# Patient Record
Sex: Male | Born: 1975 | Race: White | Hispanic: No | Marital: Single | Smoking: Current every day smoker
Health system: Southern US, Community
[De-identification: ages and names within clinical notes are randomized; demographics above are authoritative.]

## PROBLEM LIST (undated history)

## (undated) DIAGNOSIS — I10 Essential (primary) hypertension: Secondary | ICD-10-CM

## (undated) DIAGNOSIS — K859 Acute pancreatitis without necrosis or infection, unspecified: Secondary | ICD-10-CM

## (undated) DIAGNOSIS — F1011 Alcohol abuse, in remission: Secondary | ICD-10-CM

## (undated) HISTORY — PX: APPENDECTOMY: SHX54

## (undated) HISTORY — PX: BACK SURGERY: SHX140

---

## 2018-02-06 ENCOUNTER — Emergency Department (HOSPITAL_BASED_OUTPATIENT_CLINIC_OR_DEPARTMENT_OTHER)
Admission: EM | Admit: 2018-02-06 | Discharge: 2018-02-06 | Disposition: A | Discharge status: Home / Self Care | Payer: Self-pay | Attending: Emergency Medicine | Admitting: Emergency Medicine

## 2018-02-06 ENCOUNTER — Encounter (HOSPITAL_BASED_OUTPATIENT_CLINIC_OR_DEPARTMENT_OTHER): Payer: Self-pay | Admitting: Emergency Medicine

## 2018-02-06 ENCOUNTER — Other Ambulatory Visit: Payer: Self-pay

## 2018-02-06 DIAGNOSIS — F17200 Nicotine dependence, unspecified, uncomplicated: Secondary | ICD-10-CM | POA: Insufficient documentation

## 2018-02-06 DIAGNOSIS — I10 Essential (primary) hypertension: Secondary | ICD-10-CM | POA: Insufficient documentation

## 2018-02-06 DIAGNOSIS — R000 Tachycardia, unspecified: Secondary | ICD-10-CM | POA: Insufficient documentation

## 2018-02-06 DIAGNOSIS — R1084 Generalized abdominal pain: Secondary | ICD-10-CM | POA: Insufficient documentation

## 2018-02-06 DIAGNOSIS — R112 Nausea with vomiting, unspecified: Secondary | ICD-10-CM | POA: Insufficient documentation

## 2018-02-06 HISTORY — DX: Acute pancreatitis without necrosis or infection, unspecified: K85.90

## 2018-02-06 HISTORY — DX: Essential (primary) hypertension: I10

## 2018-02-06 LAB — URINALYSIS, ROUTINE W REFLEX MICROSCOPIC
Glucose, UA: NEGATIVE mg/dL
Ketones, ur: >80 mg/dL — AB
Leukocytes, UA: NEGATIVE
Nitrite: NEGATIVE
Protein, ur: >300 mg/dL — AB
Specific Gravity, Urine: >1.03 — ABNORMAL HIGH (ref 1.005–1.030)
pH: 6 (ref 5.0–8.0)

## 2018-02-06 LAB — CBC WITH DIFFERENTIAL/PLATELET
Basophils Absolute: 0 10*3/uL (ref 0.0–0.1)
Basophils Relative: 0 %
Eosinophils Absolute: 0 10*3/uL (ref 0.0–0.7)
Eosinophils Relative: 0 %
HCT: 49.3 % (ref 39.0–52.0)
Hemoglobin: 17.5 g/dL — ABNORMAL HIGH (ref 13.0–17.0)
Lymphocytes Relative: 2 %
Lymphs Abs: 0.3 10*3/uL — ABNORMAL LOW (ref 0.7–4.0)
MCH: 34.7 pg — ABNORMAL HIGH (ref 26.0–34.0)
MCHC: 35.5 g/dL (ref 30.0–36.0)
MCV: 97.8 fL (ref 78.0–100.0)
Monocytes Absolute: 1.3 10*3/uL — ABNORMAL HIGH (ref 0.1–1.0)
Monocytes Relative: 8 %
Neutro Abs: 14.2 10*3/uL — ABNORMAL HIGH (ref 1.7–7.7)
Neutrophils Relative %: 90 %
Platelets: 258 10*3/uL (ref 150–400)
RBC: 5.04 MIL/uL (ref 4.22–5.81)
RDW: 12.2 % (ref 11.5–15.5)
WBC: 15.9 10*3/uL — ABNORMAL HIGH (ref 4.0–10.5)

## 2018-02-06 LAB — COMPREHENSIVE METABOLIC PANEL
ALT: 57 U/L — ABNORMAL HIGH (ref 0–44)
AST: 76 U/L — ABNORMAL HIGH (ref 15–41)
Albumin: 5.5 g/dL — ABNORMAL HIGH (ref 3.5–5.0)
Alkaline Phosphatase: 79 U/L (ref 38–126)
Anion gap: 40 — ABNORMAL HIGH (ref 5–15)
BUN: 27 mg/dL — ABNORMAL HIGH (ref 6–20)
CO2: 13 mmol/L — ABNORMAL LOW (ref 22–32)
Calcium: 9.7 mg/dL (ref 8.9–10.3)
Chloride: 85 mmol/L — ABNORMAL LOW (ref 98–111)
Creatinine, Ser: 1.93 mg/dL — ABNORMAL HIGH (ref 0.61–1.24)
GFR calc Af Amer: 47 mL/min — ABNORMAL LOW (ref >60)
GFR calc non Af Amer: 41 mL/min — ABNORMAL LOW (ref >60)
Glucose, Bld: 208 mg/dL — ABNORMAL HIGH (ref 70–99)
Potassium: 3.6 mmol/L (ref 3.5–5.1)
Sodium: 138 mmol/L (ref 135–145)
Total Bilirubin: 4.5 mg/dL — ABNORMAL HIGH (ref 0.3–1.2)
Total Protein: 9.7 g/dL — ABNORMAL HIGH (ref 6.5–8.1)

## 2018-02-06 LAB — URINALYSIS, MICROSCOPIC (REFLEX)

## 2018-02-06 LAB — LIPASE, BLOOD: Lipase: 66 U/L — ABNORMAL HIGH (ref 11–51)

## 2018-02-06 MED ORDER — ONDANSETRON HCL 4 MG/2ML IJ SOLN
4.0000 mg | Freq: Once | INTRAMUSCULAR | Status: AC
  Administered 2018-02-06: 4 mg via INTRAVENOUS
  Filled 2018-02-06: qty 2

## 2018-02-06 MED ORDER — GI COCKTAIL ~~LOC~~
30.0000 mL | Freq: Once | ORAL | Status: AC
  Administered 2018-02-06: 30 mL via ORAL
  Filled 2018-02-06: qty 30

## 2018-02-06 MED ORDER — HALOPERIDOL LACTATE 5 MG/ML IJ SOLN
2.0000 mg | Freq: Once | INTRAMUSCULAR | Status: AC
  Administered 2018-02-06: 2 mg via INTRAVENOUS
  Filled 2018-02-06: qty 1

## 2018-02-06 MED ORDER — ONDANSETRON HCL 4 MG PO TABS: 4.0000 mg | ORAL_TABLET | Freq: Four times a day (QID) | ORAL | 0 refills | Status: DC

## 2018-02-06 MED ORDER — HYDROMORPHONE HCL 1 MG/ML IJ SOLN
1.0000 mg | Freq: Once | INTRAMUSCULAR | Status: AC
  Administered 2018-02-06: 1 mg via INTRAVENOUS
  Filled 2018-02-06: qty 1

## 2018-02-06 MED ORDER — CAPSAICIN 0.075 % EX CREA
TOPICAL_CREAM | Freq: Two times a day (BID) | CUTANEOUS | Status: DC
  Administered 2018-02-06: 09:00:00 via TOPICAL
  Filled 2018-02-06: qty 60

## 2018-02-06 MED ORDER — LACTATED RINGERS IV BOLUS
1000.0000 mL | Freq: Once | INTRAVENOUS | Status: AC
  Administered 2018-02-06: 1000 mL via INTRAVENOUS

## 2018-02-06 MED ORDER — SODIUM CHLORIDE 0.9 % IV BOLUS
1000.0000 mL | Freq: Once | INTRAVENOUS | Status: AC
  Administered 2018-02-06: 1000 mL via INTRAVENOUS

## 2018-02-06 MED ORDER — FAMOTIDINE IN NACL 20-0.9 MG/50ML-% IV SOLN
20.0000 mg | Freq: Once | INTRAVENOUS | Status: AC
  Administered 2018-02-06: 20 mg via INTRAVENOUS
  Filled 2018-02-06: qty 50

## 2018-02-06 MED FILL — ONDANSETRON HCL 4 MG TABLET: 4 | 2 days supply | Qty: 8 | Fill #0

## 2018-02-06 NOTE — ED Triage Notes (Signed)
Abdominal pain with N/V x 2 days, no diarrhea.  No known fever.

## 2018-02-06 NOTE — ED Provider Notes (Signed)
[facility] EMERGENCY DEPARTMENT Provider Note   CSN: [id number] Arrival date & time: 02/06/18  0800     History   Chief Complaint Chief Complaint  Patient presents with  . Abdominal Pain    HPI Brandon Fletcher is a 42 y.o. male.  HPI   42yM with 2d history of abdominal pain and n/v. Says he hasnt kept anything down in past day. No fever or chills. No diarrhea. Diffuse abdominal pain. No sick contacts. No urinary complaints. Drinks alcohol regularly. Occasional marijuana. Hasn't tired anything for symptoms.   Past Medical History:  Diagnosis Date  . Hypertension   . Pancreatitis     There are no active problems to display for this patient.        Home Medications    Prior to Admission medications   Not on File    Family History No family history on file.  Social History Social History   Tobacco Use  . Smoking status: Current Every Day Smoker  . Smokeless tobacco: Never Used  Substance Use Topics  . Alcohol use: Yes  . Drug use: Yes    Types: Marijuana     Allergies   Patient has no known allergies.   Review of Systems Review of Systems All systems reviewed and negative, other than as noted in HPI.  Physical Exam Updated Vital Signs BP (!) 188/122 (BP Location: Left Arm)   Pulse (!) 126   Temp (!) 97.5 F (36.4 C) (Oral)   Ht 6' (1.829 m)   Wt 79.4 kg   SpO2 99%   BMI 23.73 kg/m   Physical Exam  Constitutional: He appears well-developed and well-nourished. No distress.  HENT:  Head: Normocephalic and atraumatic.  Eyes: Conjunctivae are normal. Right eye exhibits no discharge. Left eye exhibits no discharge.  Neck: Neck supple.  Cardiovascular: Regular rhythm and normal heart sounds. Exam reveals no gallop and no friction rub.  No murmur heard. tachycardic  Pulmonary/Chest: Effort normal and breath sounds normal. No respiratory distress.  Abdominal: Soft. He exhibits no distension. There is tenderness.  Mild diffuse ttp    Musculoskeletal: He exhibits no edema or tenderness.  Neurological: He is alert.  Skin: Skin is warm and dry.  Psychiatric: He has a normal mood and affect. His behavior is normal. Thought content normal.  Nursing note and vitals reviewed.   All systems reviewed and negative, other than as noted in HPI.  ED Treatments / Results  Labs (all labs ordered are listed, but only abnormal results are displayed) Labs Reviewed  LIPASE, BLOOD - Abnormal; Notable for the following components:      Result Value   Lipase 66 (*)    All other components within normal limits  URINALYSIS, ROUTINE W REFLEX MICROSCOPIC - Abnormal; Notable for the following components:   Specific Gravity, Urine >1.030 (*)    Hgb urine dipstick LARGE (*)    Bilirubin Urine MODERATE (*)    Ketones, ur >80 (*)    Protein, ur >300 (*)    All other components within normal limits  COMPREHENSIVE METABOLIC PANEL - Abnormal; Notable for the following components:   Chloride 85 (*)    CO2 13 (*)    Glucose, Bld 208 (*)    BUN 27 (*)    Creatinine, Ser 1.93 (*)    Total Protein 9.7 (*)    Albumin 5.5 (*)    AST 76 (*)    ALT 57 (*)    Total Bilirubin 4.5 (*)  GFR calc non Af Amer 41 (*)    GFR calc Af Amer 47 (*)    Anion gap 40 (*)    All other components within normal limits  CBC WITH DIFFERENTIAL/PLATELET - Abnormal; Notable for the following components:   WBC 15.9 (*)    Hemoglobin 17.5 (*)    MCH 34.7 (*)    Neutro Abs 14.2 (*)    Lymphs Abs 0.3 (*)    Monocytes Absolute 1.3 (*)    All other components within normal limits  URINALYSIS, MICROSCOPIC (REFLEX) - Abnormal; Notable for the following components:   Bacteria, UA MANY (*)    All other components within normal limits    EKG None  Radiology No results found.  Procedures Procedures (including critical care time)  Medications Ordered in ED Medications  lactated ringers bolus 1,000 mL (1,000 mLs Intravenous IV Pump Association 02/06/18 0832)   famotidine (PEPCID) IVPB 20 mg premix ( Intravenous Rate/Dose Verify 02/06/18 0835)  ondansetron (ZOFRAN) injection 4 mg (4 mg Intravenous Given 02/06/18 0824)  HYDROmorphone (DILAUDID) injection 1 mg (1 mg Intravenous Given 02/06/18 0825)     Initial Impression / Assessment and Plan / ED Course  I have reviewed the triage vital signs and the nursing notes.  Pertinent labs & imaging results that were available during my care of the patient were reviewed by me and considered in my medical decision making (see chart for details).  42yM with abdominal pain and n/v. Suspect gastritis. Pt admits to drinking several "cocktails" per day. He got the most relief after having a GI "cocktail" here. Diffsue abdominal tenderness on exam. Markedly elevated anion gap acidosis. He received 2 L of IVF and meds. He is not interested in being admitted. Recommended staying for an additional liter of IVF particularly since he does not have established outpt FU but he declines. Symptoms much better. "I feel great man. I need to catch an Benedetto Goad so I can try and salvage some of the work day." He is tolerating PO. Advised of lab abnormalities. Advised of health risks of continued heavy drinking. Return precautions discussed. Prescribed zofran. Encouraged to drink plenty of fluids.   Final Clinical Impressions(s) / ED Diagnoses   Final diagnoses:  Generalized abdominal pain  Non-intractable vomiting with nausea, unspecified vomiting type    ED Discharge Orders         Ordered    ondansetron (ZOFRAN) 4 MG tablet  Every 6 hours     02/06/18 1032           Raeford Razor, MD 02/07/18 [phone]

## 2018-06-30 ENCOUNTER — Emergency Department (HOSPITAL_BASED_OUTPATIENT_CLINIC_OR_DEPARTMENT_OTHER)
Admission: EM | Admit: 2018-06-30 | Discharge: 2018-07-01 | Disposition: A | Discharge status: Home / Self Care | Payer: Self-pay | Attending: Emergency Medicine | Admitting: Emergency Medicine

## 2018-06-30 ENCOUNTER — Encounter (HOSPITAL_BASED_OUTPATIENT_CLINIC_OR_DEPARTMENT_OTHER): Payer: Self-pay

## 2018-06-30 ENCOUNTER — Other Ambulatory Visit: Payer: Self-pay

## 2018-06-30 DIAGNOSIS — F1010 Alcohol abuse, uncomplicated: Secondary | ICD-10-CM | POA: Insufficient documentation

## 2018-06-30 DIAGNOSIS — R1110 Vomiting, unspecified: Secondary | ICD-10-CM

## 2018-06-30 DIAGNOSIS — Z79899 Other long term (current) drug therapy: Secondary | ICD-10-CM | POA: Insufficient documentation

## 2018-06-30 DIAGNOSIS — F17200 Nicotine dependence, unspecified, uncomplicated: Secondary | ICD-10-CM | POA: Insufficient documentation

## 2018-06-30 DIAGNOSIS — I10 Essential (primary) hypertension: Secondary | ICD-10-CM | POA: Insufficient documentation

## 2018-06-30 HISTORY — DX: Alcohol abuse, in remission: F10.11

## 2018-06-30 LAB — CBC
HCT: 50.5 % (ref 39.0–52.0)
Hemoglobin: 17.8 g/dL — ABNORMAL HIGH (ref 13.0–17.0)
MCH: 34.3 pg — ABNORMAL HIGH (ref 26.0–34.0)
MCHC: 35.2 g/dL (ref 30.0–36.0)
MCV: 97.3 fL (ref 80.0–100.0)
NRBC: 0 % (ref 0.0–0.2)
Platelets: 382 10*3/uL (ref 150–400)
RBC: 5.19 MIL/uL (ref 4.22–5.81)
RDW: 11.4 % — ABNORMAL LOW (ref 11.5–15.5)
WBC: 10.6 10*3/uL — AB (ref 4.0–10.5)

## 2018-06-30 LAB — COMPREHENSIVE METABOLIC PANEL
ALBUMIN: 5.1 g/dL — AB (ref 3.5–5.0)
ALK PHOS: 77 U/L (ref 38–126)
ALT: 33 U/L (ref 0–44)
AST: 52 U/L — ABNORMAL HIGH (ref 15–41)
Anion gap: 17 — ABNORMAL HIGH (ref 5–15)
BILIRUBIN TOTAL: 1.5 mg/dL — AB (ref 0.3–1.2)
BUN: 8 mg/dL (ref 6–20)
CO2: 22 mmol/L (ref 22–32)
Calcium: 10.6 mg/dL — ABNORMAL HIGH (ref 8.9–10.3)
Chloride: 97 mmol/L — ABNORMAL LOW (ref 98–111)
Creatinine, Ser: 0.91 mg/dL (ref 0.61–1.24)
GFR calc non Af Amer: >60 mL/min (ref >60)
GLUCOSE: 169 mg/dL — AB (ref 70–99)
Potassium: 3.3 mmol/L — ABNORMAL LOW (ref 3.5–5.1)
Sodium: 136 mmol/L (ref 135–145)
TOTAL PROTEIN: 8.7 g/dL — AB (ref 6.5–8.1)

## 2018-06-30 LAB — URINALYSIS, MICROSCOPIC (REFLEX): Bacteria, UA: NONE SEEN

## 2018-06-30 LAB — URINALYSIS, ROUTINE W REFLEX MICROSCOPIC
Bilirubin Urine: NEGATIVE
Glucose, UA: NEGATIVE mg/dL
Hgb urine dipstick: NEGATIVE
Ketones, ur: 40 mg/dL — AB
Leukocytes,Ua: NEGATIVE
NITRITE: NEGATIVE
pH: 7 (ref 5.0–8.0)

## 2018-06-30 LAB — RAPID URINE DRUG SCREEN, HOSP PERFORMED
AMPHETAMINES: NOT DETECTED
Barbiturates: NOT DETECTED
Benzodiazepines: NOT DETECTED
COCAINE: NOT DETECTED
OPIATES: NOT DETECTED
TETRAHYDROCANNABINOL: POSITIVE — AB

## 2018-06-30 LAB — LIPASE, BLOOD: Lipase: 68 U/L — ABNORMAL HIGH (ref 11–51)

## 2018-06-30 MED ORDER — DIPHENHYDRAMINE HCL 50 MG/ML IJ SOLN
50.0000 mg | Freq: Once | INTRAMUSCULAR | Status: AC
  Administered 2018-06-30: 50 mg via INTRAVENOUS
  Filled 2018-06-30: qty 1

## 2018-06-30 MED ORDER — METOCLOPRAMIDE HCL 5 MG/ML IJ SOLN
10.0000 mg | Freq: Once | INTRAMUSCULAR | Status: AC
  Administered 2018-06-30: 10 mg via INTRAVENOUS
  Filled 2018-06-30: qty 2

## 2018-06-30 MED ORDER — CLONIDINE HCL 0.1 MG PO TABS
0.2000 mg | ORAL_TABLET | Freq: Once | ORAL | Status: AC
  Administered 2018-06-30: 0.2 mg via ORAL
  Filled 2018-06-30: qty 2

## 2018-06-30 MED ORDER — CAPSAICIN 0.075 % EX CREA
TOPICAL_CREAM | Freq: Two times a day (BID) | CUTANEOUS | Status: DC
  Administered 2018-06-30: via TOPICAL
  Filled 2018-06-30 (×2): qty 60

## 2018-06-30 MED ORDER — SODIUM CHLORIDE 0.9% FLUSH
3.0000 mL | Freq: Once | INTRAVENOUS | Status: DC
  Filled 2018-06-30: qty 3

## 2018-06-30 MED ORDER — ONDANSETRON HCL 4 MG/2ML IJ SOLN
4.0000 mg | Freq: Once | INTRAMUSCULAR | Status: AC | PRN
  Administered 2018-06-30: 4 mg via INTRAVENOUS
  Filled 2018-06-30: qty 2

## 2018-06-30 MED ORDER — HALOPERIDOL LACTATE 5 MG/ML IJ SOLN
2.0000 mg | Freq: Once | INTRAMUSCULAR | Status: AC
  Administered 2018-06-30: 2 mg via INTRAVENOUS
  Filled 2018-06-30: qty 1

## 2018-06-30 MED ORDER — LACTATED RINGERS IV BOLUS
2000.0000 mL | Freq: Once | INTRAVENOUS | Status: AC
  Administered 2018-06-30: 1000 mL via INTRAVENOUS

## 2018-06-30 MED ORDER — CHLORDIAZEPOXIDE HCL 25 MG PO CAPS
50.0000 mg | ORAL_CAPSULE | Freq: Once | ORAL | Status: AC
  Administered 2018-06-30: 50 mg via ORAL
  Filled 2018-06-30: qty 2

## 2018-06-30 MED ORDER — PANTOPRAZOLE SODIUM 40 MG IV SOLR
40.0000 mg | Freq: Once | INTRAVENOUS | Status: AC
  Administered 2018-06-30: 40 mg via INTRAVENOUS
  Filled 2018-06-30: qty 40

## 2018-06-30 NOTE — ED Triage Notes (Signed)
Pt c/o abd pain, n/v-since 3pm after drinking protein shake-states he was seen at [facility] ED yesterday for same-felt better until 3pm

## 2018-06-30 NOTE — ED Provider Notes (Signed)
[facility] EMERGENCY DEPARTMENT Provider Note   CSN: [id number] Arrival date & time: 06/30/18  1909    History   Chief Complaint Chief Complaint  Patient presents with  . Abdominal Pain    HPI [name] is a 43 y.o. male.     HPI Patient reports that he was better yesterday evening when [facility].  He had been treated for pain and CT scan was done which did not show any acute findings.  [facility] and reportedly had a full work-up.  Including upper endoscopy which did not show any significant acute findings but may be a small tear per the patient's companion he went through several days of alcohol withdrawal.  He had stopped drinking but now he is drinking sometimes again.  Last drink was about 48 hours ago.  His companion believes that sometimes the symptoms are either in association with her may be triggered by alcohol withdrawal.  Patient endorses some marijuana use but denies smoking much, reports last use about a week ago.  He reports marijuana improves his appetite and his stomach.  He denies any other drugs of abuse. Past Medical History:  Diagnosis Date  . H/O ETOH abuse   . Hypertension   . Pancreatitis     There are no active problems to display for this patient.   Past Surgical History:  Procedure Laterality Date  . APPENDECTOMY          Home Medications    Prior to Admission medications   Medication Sig Start Date End Date Taking? Authorizing Provider  chlordiazePOXIDE (LIBRIUM) 25 MG capsule 50mg  PO TID x 1D, then 25-50mg  PO BID X 1D, then 25-50mg  PO QD X 1D 07/01/18   Arby BarrettePfeiffer, Ilario Dhaliwal, MD  cloNIDine (CATAPRES - DOSED IN MG/24 HR) 0.1 mg/24hr patch Place 1 patch (0.1 mg total) onto the skin once a week. 07/01/18   Arby BarrettePfeiffer, Everard Interrante, MD  diphenhydrAMINE (BENADRYL) 25 MG tablet Take 25 mg of Benadryl with Reglan every 6 hours as needed for the next 2 to 3 days. 07/01/18    Arby BarrettePfeiffer, Gale Klar, MD  metoCLOPramide (REGLAN) 10 MG tablet Take 1 tablet (10 mg total) by mouth every 6 (six) hours as needed for nausea (nausea/headache). 07/01/18   Arby BarrettePfeiffer, Lillieann Pavlich, MD  ondansetron (ZOFRAN ODT) 4 MG disintegrating tablet Take 1 tablet (4 mg total) by mouth every 4 (four) hours as needed for nausea or vomiting. 07/01/18   Arby BarrettePfeiffer, Joud Pettinato, MD  ondansetron (ZOFRAN) 4 MG tablet Take 1 tablet (4 mg total) by mouth every 6 (six) hours. 02/06/18   Raeford RazorKohut, Stephen, MD  pantoprazole (PROTONIX) 20 MG tablet Take 1 tablet (20 mg total) by mouth daily. 07/01/18   Arby BarrettePfeiffer, Mikenna Bunkley, MD    Family History No family history on file.  Social History Social History   Tobacco Use  . Smoking status: Current Every Day Smoker  . Smokeless tobacco: Never Used  Substance Use Topics  . Alcohol use: Not Currently    Frequency: Never  . Drug use: Yes    Types: Marijuana     Allergies   Patient has no known allergies.   Review of Systems Review of Systems 10 Systems reviewed and are negative for acute change except as noted in the HPI.   Physical Exam Updated Vital Signs BP (!) 179/119   Pulse 77   Temp 98.4 F (36.9 C) (Oral)   Resp 12   Ht  6' (1.829 m)   Wt 73.5 kg   SpO2 100%   BMI 21.97 kg/m   Physical Exam Constitutional:      Comments: Patient is alert and nontoxic.  No respiratory distress.  Mental status clear.  Sometimes he writhes around because of his abdominal pain but at times as we are conversing he remains still and appropriate.  HENT:     Head: Normocephalic and atraumatic.     Mouth/Throat:     Mouth: Mucous membranes are moist.     Pharynx: Oropharynx is clear.  Eyes:     Extraocular Movements: Extraocular movements intact.     Conjunctiva/sclera: Conjunctivae normal.  Cardiovascular:     Rate and Rhythm: Normal rate and regular rhythm.     Pulses: Normal pulses.     Heart sounds: Normal heart sounds.  Pulmonary:     Effort: Pulmonary effort is normal.      Breath sounds: Normal breath sounds.  Abdominal:     General: There is no distension.     Palpations: Abdomen is soft.     Tenderness: There is no abdominal tenderness. There is no guarding.     Comments: Abdomen soft.  When patient is not writhing about there is no guarding or rebound tenderness.  Patient does not appear to have pain exacerbated by palpation.  Musculoskeletal: Normal range of motion.        General: No swelling or tenderness.  Skin:    General: Skin is warm and dry.  Neurological:     General: No focal deficit present.     Mental Status: He is oriented to person, place, and time.     Coordination: Coordination normal.  Psychiatric:        Mood and Affect: Mood normal.      ED Treatments / Results  Labs (all labs ordered are listed, but only abnormal results are displayed) Labs Reviewed  LIPASE, BLOOD - Abnormal; Notable for the following components:      Result Value   Lipase 68 (*)    All other components within normal limits  COMPREHENSIVE METABOLIC PANEL - Abnormal; Notable for the following components:   Potassium 3.3 (*)    Chloride 97 (*)    Glucose, Bld 169 (*)    Calcium 10.6 (*)    Total Protein 8.7 (*)    Albumin 5.1 (*)    AST 52 (*)    Total Bilirubin 1.5 (*)    Anion gap 17 (*)    All other components within normal limits  CBC - Abnormal; Notable for the following components:   WBC 10.6 (*)    Hemoglobin 17.8 (*)    MCH 34.3 (*)    RDW 11.4 (*)    All other components within normal limits  URINALYSIS, ROUTINE W REFLEX MICROSCOPIC - Abnormal; Notable for the following components:   Color, Urine AMBER (*)    APPearance HAZY (*)    Specific Gravity, Urine >1.030 (*)    Ketones, ur 40 (*)    Protein, ur >300 (*)    All other components within normal limits  RAPID URINE DRUG SCREEN, HOSP PERFORMED - Abnormal; Notable for the following components:   Tetrahydrocannabinol POSITIVE (*)    All other components within normal limits   URINALYSIS, MICROSCOPIC (REFLEX)    EKG None  Radiology No results found.  Procedures Procedures (including critical care time)  Medications Ordered in ED Medications  sodium chloride flush (NS) 0.9 % injection 3 mL (3  mLs Intravenous Not Given 06/30/18 2101)  capsicum (ZOSTRIX) 0.075 % cream ( Topical Given 06/30/18 2345)  ondansetron (ZOFRAN) injection 4 mg (4 mg Intravenous Given 06/30/18 2100)  lactated ringers bolus 2,000 mL (1,000 mLs Intravenous New Bag/Given 06/30/18 2259)  metoCLOPramide (REGLAN) injection 10 mg (10 mg Intravenous Given 06/30/18 2258)  diphenhydrAMINE (BENADRYL) injection 50 mg (50 mg Intravenous Given 06/30/18 2259)  pantoprazole (PROTONIX) injection 40 mg (40 mg Intravenous Given 06/30/18 2258)  cloNIDine (CATAPRES) tablet 0.2 mg (0.2 mg Oral Given 06/30/18 2258)  chlordiazePOXIDE (LIBRIUM) capsule 50 mg (50 mg Oral Given 06/30/18 2339)  haloperidol lactate (HALDOL) injection 2 mg (2 mg Intravenous Given 06/30/18 2345)     Initial Impression / Assessment and Plan / ED Course  I have reviewed the triage vital signs and the nursing notes.  Pertinent labs & imaging results that were available during my care of the patient were reviewed by me and considered in my medical decision making (see chart for details).  Clinical Course as of Jul 01 10  Mon Jun 30, 2018  2356 I have just walked into the room and the patient is lying alert but comfortable in supine position talking to his companion.  No grimacing or writhing about.  No appearance of tremor or agitation.  Blood pressure is 157/100.  The longer I am in the room discussing a plan, patient begins drawing his legs up and grimacing, reporting the pain is still coming back.   [MP]    Clinical Course User Index [MP] Arby Barrette, MD       Patient has history of alcohol abuse.  He was recently admitted at [facility].  He had an upper endoscopy and CT scans done with no acute findings.  His  companion reports he went through about 4 days of alcohol withdrawal as an inpatient.  Patient was seen last night at [facility] emergency department for abdominal pain as well.  He had CT scan done yesterday as well with no acute findings.  Patient's clinical appearance is nontoxic and alert.  Does not clinically appear to be in alcohol withdrawal.  He reports last drink approximately 24 hours ago.  Mental status is very crisp and clear, he does not have tremor, diaphoresis.  His appearance is quite typical for cannabinoids hyperemesis however the patient is emphatic that this could not be the case because marijuana always makes him feel better.  He did not have any actual vomiting in the emergency department but more typical writhing due to episodic abdominal pains.  Labs today do not suggest any decline in the patient's metabolic profile or hydration.  Patient was given 2 L of rehydration and Reglan Benadryl and Protonix.  He also seemed to respond actually quite well to Capzasin and Haldol.  Patient made multiple requests for pain medications but at this time I do not feel that narcotics are appropriate for the patient's symptoms.  Once patient had become calm, blood pressures were significantly improving.  Patient has been given oral clonidine and Librium.  He will be discharged with Librium over 3 days and clonidine patch with instructions to resume his previously prescribed antihypertensive when symptoms improved and discontinue the clonidine.  He is counseled that it is imperative he have follow-up with the PCP soon for management of medications, particularly blood pressure monitoring.  Patient has been provided with the resource guide for [facility] community health and wellness as well as substance abuse resource guide. Final Clinical Impressions(s) / ED Diagnoses  Final diagnoses:  Hyperemesis  Alcohol abuse    ED Discharge Orders         Ordered    metoCLOPramide (REGLAN) 10 MG tablet  Every 6 hours  PRN     07/01/18 0004    diphenhydrAMINE (BENADRYL) 25 MG tablet     07/01/18 0004    chlordiazePOXIDE (LIBRIUM) 25 MG capsule     07/01/18 0004    pantoprazole (PROTONIX) 20 MG tablet  Daily     07/01/18 0004    ondansetron (ZOFRAN ODT) 4 MG disintegrating tablet  Every 4 hours PRN     07/01/18 0004    cloNIDine (CATAPRES - DOSED IN MG/24 HR) 0.1 mg/24hr patch  Weekly     07/01/18 0004           Arby Barrette, MD 07/01/18 1610

## 2018-06-30 NOTE — ED Notes (Signed)
Pt seen walking out of the dept.  No acute distress noted.

## 2018-07-01 MED ORDER — PANTOPRAZOLE SODIUM 20 MG PO TBEC: 20.0000 mg | DELAYED_RELEASE_TABLET | Freq: Every day | ORAL | 0 refills | Status: DC

## 2018-07-01 MED ORDER — CLONIDINE 0.1 MG/24HR TD PTWK: 0.1000 mg | MEDICATED_PATCH | TRANSDERMAL | 0 refills | Status: DC

## 2018-07-01 MED ORDER — METOCLOPRAMIDE HCL 10 MG PO TABS: 10.0000 mg | ORAL_TABLET | Freq: Four times a day (QID) | ORAL | 0 refills | Status: AC | PRN

## 2018-07-01 MED ORDER — ONDANSETRON 4 MG PO TBDP: 4.0000 mg | ORAL_TABLET | ORAL | 0 refills | Status: DC | PRN

## 2018-07-01 MED ORDER — CHLORDIAZEPOXIDE HCL 25 MG PO CAPS: ORAL_CAPSULE | ORAL | 0 refills | Status: DC

## 2018-07-01 MED ORDER — DIPHENHYDRAMINE HCL 25 MG PO TABS: ORAL_TABLET | ORAL | 0 refills | Status: DC

## 2018-07-01 NOTE — ED Notes (Signed)
DC instructions given to the pt and family member, family member asking if pt is safe to drive back home, pt is AO x4 at dc time no dizziness or drowsiness noticed, pt oriented that if he feels dizzy or sleepy he can wait on the waiting room until he feels ready to drive, family member states she lives one hour away and she can't drive him home. Pt got out to the lobby with steady gait. No acute distress noticed.

## 2018-07-01 NOTE — Discharge Instructions (Signed)
1.  Start taking Protonix daily.  You may also take Carafate that had been previously prescribed.  Take Reglan with Benadryl as needed over the next 2 to 4 days until the Protonix and Carafate are working. 2.  Take Zofran if needed for nausea. 3.  You have been given a prescription for a clonidine patch.  This can be used for blood pressure control if you are having nausea.  You should be resuming your previously prescribed blood pressure medication are improved.  Very important that you are monitoring your blood pressure at home in writing down your readings.  They should be shown to your family doctor.  You need a family doctor for recheck as soon as possible.  Use the referral number in your discharge instructions to find one.

## 2019-01-13 ENCOUNTER — Other Ambulatory Visit: Payer: Self-pay

## 2019-01-13 ENCOUNTER — Emergency Department (HOSPITAL_BASED_OUTPATIENT_CLINIC_OR_DEPARTMENT_OTHER)
Admission: EM | Admit: 2019-01-13 | Discharge: 2019-01-13 | Disposition: A | Discharge status: Home / Self Care | Payer: Self-pay | Attending: Emergency Medicine | Admitting: Emergency Medicine

## 2019-01-13 ENCOUNTER — Encounter (HOSPITAL_BASED_OUTPATIENT_CLINIC_OR_DEPARTMENT_OTHER): Payer: Self-pay | Admitting: Emergency Medicine

## 2019-01-13 DIAGNOSIS — I10 Essential (primary) hypertension: Secondary | ICD-10-CM | POA: Insufficient documentation

## 2019-01-13 DIAGNOSIS — F17210 Nicotine dependence, cigarettes, uncomplicated: Secondary | ICD-10-CM | POA: Insufficient documentation

## 2019-01-13 DIAGNOSIS — E872 Acidosis: Secondary | ICD-10-CM | POA: Insufficient documentation

## 2019-01-13 DIAGNOSIS — E8729 Other acidosis: Secondary | ICD-10-CM

## 2019-01-13 DIAGNOSIS — R112 Nausea with vomiting, unspecified: Secondary | ICD-10-CM | POA: Insufficient documentation

## 2019-01-13 LAB — CBC
HCT: 44.2 % (ref 39.0–52.0)
Hemoglobin: 15.5 g/dL (ref 13.0–17.0)
MCH: 34.2 pg — ABNORMAL HIGH (ref 26.0–34.0)
MCHC: 35.1 g/dL (ref 30.0–36.0)
MCV: 97.6 fL (ref 80.0–100.0)
Platelets: 227 10*3/uL (ref 150–400)
RBC: 4.53 MIL/uL (ref 4.22–5.81)
RDW: 12.1 % (ref 11.5–15.5)
WBC: 15.4 10*3/uL — ABNORMAL HIGH (ref 4.0–10.5)
nRBC: 0 % (ref 0.0–0.2)

## 2019-01-13 LAB — URINALYSIS, MICROSCOPIC (REFLEX): Bacteria, UA: NONE SEEN

## 2019-01-13 LAB — URINALYSIS, ROUTINE W REFLEX MICROSCOPIC
Bilirubin Urine: NEGATIVE
Glucose, UA: NEGATIVE mg/dL
Ketones, ur: >80 mg/dL — AB
Leukocytes,Ua: NEGATIVE
Nitrite: NEGATIVE
Protein, ur: >300 mg/dL — AB
Specific Gravity, Urine: >1.03 — ABNORMAL HIGH (ref 1.005–1.030)
pH: 6 (ref 5.0–8.0)

## 2019-01-13 LAB — COMPREHENSIVE METABOLIC PANEL
ALT: 67 U/L — ABNORMAL HIGH (ref 0–44)
AST: 119 U/L — ABNORMAL HIGH (ref 15–41)
Albumin: 4.9 g/dL (ref 3.5–5.0)
Alkaline Phosphatase: 90 U/L (ref 38–126)
Anion gap: 32 — ABNORMAL HIGH (ref 5–15)
BUN: 14 mg/dL (ref 6–20)
CO2: 18 mmol/L — ABNORMAL LOW (ref 22–32)
Calcium: 9.9 mg/dL (ref 8.9–10.3)
Chloride: 88 mmol/L — ABNORMAL LOW (ref 98–111)
Creatinine, Ser: 1.08 mg/dL (ref 0.61–1.24)
GFR calc Af Amer: >60 mL/min (ref >60)
GFR calc non Af Amer: >60 mL/min (ref >60)
Glucose, Bld: 173 mg/dL — ABNORMAL HIGH (ref 70–99)
Potassium: 3.4 mmol/L — ABNORMAL LOW (ref 3.5–5.1)
Sodium: 138 mmol/L (ref 135–145)
Total Bilirubin: 2.5 mg/dL — ABNORMAL HIGH (ref 0.3–1.2)
Total Protein: 8.7 g/dL — ABNORMAL HIGH (ref 6.5–8.1)

## 2019-01-13 LAB — LIPASE, BLOOD: Lipase: 30 U/L (ref 11–51)

## 2019-01-13 LAB — LACTIC ACID, PLASMA: Lactic Acid, Venous: 2 mmol/L (ref 0.5–1.9)

## 2019-01-13 LAB — ETHANOL: Alcohol, Ethyl (B): <10 mg/dL (ref <10)

## 2019-01-13 MED ORDER — LORAZEPAM 2 MG/ML IJ SOLN
1.0000 mg | Freq: Once | INTRAMUSCULAR | Status: AC
  Administered 2019-01-13: 23:00:00 1 mg via INTRAVENOUS
  Filled 2019-01-13: qty 1

## 2019-01-13 MED ORDER — SODIUM CHLORIDE 0.9 % IV BOLUS
1000.0000 mL | Freq: Once | INTRAVENOUS | Status: AC
  Administered 2019-01-13: 21:00:00 1000 mL via INTRAVENOUS

## 2019-01-13 MED ORDER — CHLORDIAZEPOXIDE HCL 25 MG PO CAPS: ORAL_CAPSULE | ORAL | 0 refills | Status: DC

## 2019-01-13 MED ORDER — ONDANSETRON 4 MG PO TBDP: ORAL_TABLET | ORAL | 0 refills | Status: DC

## 2019-01-13 MED ORDER — KETOROLAC TROMETHAMINE 15 MG/ML IJ SOLN
15.0000 mg | Freq: Once | INTRAMUSCULAR | Status: AC
  Administered 2019-01-13: 21:00:00 15 mg via INTRAVENOUS
  Filled 2019-01-13: qty 1

## 2019-01-13 MED ORDER — PROMETHAZINE HCL 25 MG RE SUPP: 25.0000 mg | Freq: Four times a day (QID) | RECTAL | 0 refills | Status: DC | PRN

## 2019-01-13 MED ORDER — DIPHENHYDRAMINE HCL 50 MG/ML IJ SOLN
25.0000 mg | Freq: Once | INTRAMUSCULAR | Status: AC
  Administered 2019-01-13: 25 mg via INTRAVENOUS
  Filled 2019-01-13: qty 1

## 2019-01-13 MED ORDER — CHLORDIAZEPOXIDE HCL 25 MG PO CAPS
100.0000 mg | ORAL_CAPSULE | Freq: Once | ORAL | Status: AC
  Administered 2019-01-13: 23:00:00 100 mg via ORAL
  Filled 2019-01-13: qty 4

## 2019-01-13 MED ORDER — DROPERIDOL 2.5 MG/ML IJ SOLN
2.5000 mg | Freq: Once | INTRAMUSCULAR | Status: AC
  Administered 2019-01-13: 2.5 mg via INTRAVENOUS
  Filled 2019-01-13: qty 2

## 2019-01-13 NOTE — ED Notes (Signed)
ED Provider at bedside. 

## 2019-01-13 NOTE — ED Notes (Signed)
Pt states he feels better, no active emesis

## 2019-01-13 NOTE — Discharge Instructions (Signed)
Return for worsening symptoms.  Try zantac or pepcid twice a day.  Try to avoid things that may make this worse, most commonly these are spicy foods tomato based products fatty foods chocolate and peppermint.  Alcohol and tobacco can also make this worse.  Return to the emergency department for sudden worsening pain fever or inability to eat or drink.

## 2019-01-13 NOTE — ED Triage Notes (Signed)
EMS transport from home. Vomiting since yesterday with sever abd pain. Actively vomiting on arrival. EMS gave zofran 4mg  IVP pta. No diarrhea. No fever.

## 2019-01-13 NOTE — ED Provider Notes (Signed)
[facility] EMERGENCY DEPARTMENT Provider Note   CSN: [id number] Arrival date & time: 01/13/19  1926     History   Chief Complaint No chief complaint on file.   HPI Brandon Fletcher is a 43 y.o. male.     43 yo M with a significant past medical history of recurrent nausea and vomiting comes in with a chief complaint of the same.  Going on for the past couple days.  Thinks he is vomited about 50 times today.  Has diffuse abdominal pain is worse in the epigastrium.  No noted fevers or chills.  Unable to keep anything down.  He is not sure what he typically gets for this.  The history is provided by the patient.  Illness Severity:  Moderate Onset quality:  Sudden Duration:  2 days Timing:  Constant Progression:  Worsening Chronicity:  New Associated symptoms: abdominal pain, nausea and vomiting   Associated symptoms: no chest pain, no congestion, no diarrhea, no fever, no headaches, no myalgias, no rash and no shortness of breath     Past Medical History:  Diagnosis Date  . H/O ETOH abuse   . Hypertension   . Pancreatitis     There are no active problems to display for this patient.   Past Surgical History:  Procedure Laterality Date  . APPENDECTOMY          Home Medications    Prior to Admission medications   Medication Sig Start Date End Date Taking? Authorizing Provider  sucralfate (CARAFATE) 1 g tablet Take by mouth. 06/29/18  Yes [provider]  chlordiazePOXIDE (LIBRIUM) 25 MG capsule 50mg  PO TID x 1D, then 25-50mg  PO BID X 1D, then 25-50mg  PO QD X 1D 01/13/19   Melene PlanFloyd, Delron Comer, DO  cloNIDine (CATAPRES - DOSED IN MG/24 HR) 0.1 mg/24hr patch Place 1 patch (0.1 mg total) onto the skin once a week. 07/01/18   Arby BarrettePfeiffer, Marcy, MD  diphenhydrAMINE (BENADRYL) 25 MG tablet Take 25 mg of Benadryl with Reglan every 6 hours as needed for the next 2 to 3 days. 07/01/18   Arby BarrettePfeiffer, Marcy, MD  metoCLOPramide (REGLAN) 10 MG tablet Take 1 tablet (10 mg total) by  mouth every 6 (six) hours as needed for nausea (nausea/headache). 07/01/18   Arby BarrettePfeiffer, Marcy, MD  ondansetron (ZOFRAN ODT) 4 MG disintegrating tablet 4mg  ODT q4 hours prn nausea/vomit 01/13/19   Melene PlanFloyd, Kaleiyah Polsky, DO  ondansetron (ZOFRAN) 4 MG tablet Take 1 tablet (4 mg total) by mouth every 6 (six) hours. 02/06/18   Raeford RazorKohut, Stephen, MD  pantoprazole (PROTONIX) 20 MG tablet Take 1 tablet (20 mg total) by mouth daily. 07/01/18   Arby BarrettePfeiffer, Marcy, MD  promethazine (PHENERGAN) 25 MG suppository Place 1 suppository (25 mg total) rectally every 6 (six) hours as needed for nausea or vomiting. 01/13/19   Melene PlanFloyd, Raysa Bosak, DO    Family History No family history on file.  Social History Social History   Tobacco Use  . Smoking status: Current Every Day Smoker  . Smokeless tobacco: Never Used  Substance Use Topics  . Alcohol use: Yes    Frequency: Never  . Drug use: Not Currently    Types: Marijuana     Allergies   Patient has no known allergies.   Review of Systems Review of Systems  Constitutional: Negative for chills and fever.  HENT: Negative for congestion and facial swelling.   Eyes: Negative for discharge and visual disturbance.  Respiratory: Negative for shortness of breath.   Cardiovascular: Negative for  chest pain and palpitations.  Gastrointestinal: Positive for abdominal pain, nausea and vomiting. Negative for diarrhea.  Musculoskeletal: Negative for arthralgias and myalgias.  Skin: Negative for color change and rash.  Neurological: Negative for tremors, syncope and headaches.  Psychiatric/Behavioral: Negative for confusion and dysphoric mood.     Physical Exam Updated Vital Signs BP (!) 159/101   Pulse 87   Temp 98.6 F (37 C) (Oral)   Resp 17   Ht 6' (1.829 m)   Wt 77.1 kg   SpO2 98%   BMI 23.06 kg/m   Physical Exam Vitals signs and nursing note reviewed.  Constitutional:      Appearance: He is well-developed.  HENT:     Head: Normocephalic and atraumatic.  Eyes:      Pupils: Pupils are equal, round, and reactive to light.  Neck:     Musculoskeletal: Normal range of motion and neck supple.     Vascular: No JVD.  Cardiovascular:     Rate and Rhythm: Normal rate and regular rhythm.     Heart sounds: No murmur. No friction rub. No gallop.   Pulmonary:     Effort: No respiratory distress.     Breath sounds: No wheezing.  Abdominal:     General: There is no distension.     Tenderness: There is abdominal tenderness. There is no guarding or rebound.     Comments: Mild diffuse abdominal tenderness  Musculoskeletal: Normal range of motion.  Skin:    Coloration: Skin is not pale.     Findings: No rash.  Neurological:     Mental Status: He is alert and oriented to person, place, and time.  Psychiatric:        Behavior: Behavior normal.      ED Treatments / Results  Labs (all labs ordered are listed, but only abnormal results are displayed) Labs Reviewed  COMPREHENSIVE METABOLIC PANEL - Abnormal; Notable for the following components:      Result Value   Potassium 3.4 (*)    Chloride 88 (*)    CO2 18 (*)    Glucose, Bld 173 (*)    Total Protein 8.7 (*)    AST 119 (*)    ALT 67 (*)    Total Bilirubin 2.5 (*)    Anion gap 32 (*)    All other components within normal limits  CBC - Abnormal; Notable for the following components:   WBC 15.4 (*)    MCH 34.2 (*)    All other components within normal limits  URINALYSIS, ROUTINE W REFLEX MICROSCOPIC - Abnormal; Notable for the following components:   Specific Gravity, Urine >1.030 (*)    Hgb urine dipstick MODERATE (*)    Ketones, ur >80 (*)    Protein, ur >300 (*)    All other components within normal limits  LACTIC ACID, PLASMA - Abnormal; Notable for the following components:   Lactic Acid, Venous 2.0 (*)    All other components within normal limits  LIPASE, BLOOD  ETHANOL  URINALYSIS, MICROSCOPIC (REFLEX)  LACTIC ACID, PLASMA    EKG None  Radiology No results found.  Procedures  Procedures (including critical care time)  Medications Ordered in ED Medications  LORazepam (ATIVAN) injection 1 mg (has no administration in time range)  chlordiazePOXIDE (LIBRIUM) capsule 100 mg (has no administration in time range)  droperidol (INAPSINE) 2.5 MG/ML injection 2.5 mg (2.5 mg Intravenous Given 01/13/19 2037)  diphenhydrAMINE (BENADRYL) injection 25 mg (25 mg Intravenous Given 01/13/19 2037)  sodium chloride 0.9 % bolus 1,000 mL ( Intravenous Stopped 01/13/19 2143)  ketorolac (TORADOL) 15 MG/ML injection 15 mg (15 mg Intravenous Given 01/13/19 2036)     Initial Impression / Assessment and Plan / ED Course  I have reviewed the triage vital signs and the nursing notes.  Pertinent labs & imaging results that were available during my care of the patient were reviewed by me and considered in my medical decision making (see chart for details).        43 yo M with a chief complaint of diffuse abdominal pain nausea and vomiting.  Going on for the past few days.  No focal abdominal tenderness.  Will give nausea medicine fluid bolus reassess.  Likely ketoacidosis with anion gap.  Patient feeling better and tolerating PO.    Patient concerned about quitting drinking alcohol.  I will start him on a Librium taper.  Given outpatient resources.  No hypertension or tachycardia.  We will give a dose of Ativan here does Librium.  10:32 PM:  I have discussed the diagnosis/risks/treatment options with the patient and believe the pt to be eligible for discharge home to follow-up with PCP. We also discussed returning to the ED immediately if new or worsening sx occur. We discussed the sx which are most concerning (e.g., sudden worsening pain, fever, inability to tolerate by mouth) that necessitate immediate return. Medications administered to the patient during their visit and any new prescriptions provided to the patient are listed below.  Medications given during this visit Medications  LORazepam  (ATIVAN) injection 1 mg (has no administration in time range)  chlordiazePOXIDE (LIBRIUM) capsule 100 mg (has no administration in time range)  droperidol (INAPSINE) 2.5 MG/ML injection 2.5 mg (2.5 mg Intravenous Given 01/13/19 2037)  diphenhydrAMINE (BENADRYL) injection 25 mg (25 mg Intravenous Given 01/13/19 2037)  sodium chloride 0.9 % bolus 1,000 mL ( Intravenous Stopped 01/13/19 2143)  ketorolac (TORADOL) 15 MG/ML injection 15 mg (15 mg Intravenous Given 01/13/19 2036)     The patient appears reasonably screen and/or stabilized for discharge and I doubt any other medical condition or other [facility] requiring further screening, evaluation, or treatment in the ED at this time prior to discharge.    Final Clinical Impressions(s) / ED Diagnoses   Final diagnoses:  Nausea and vomiting in adult  Ketoacidosis    ED Discharge Orders         Ordered    chlordiazePOXIDE (LIBRIUM) 25 MG capsule     01/13/19 2228    ondansetron (ZOFRAN ODT) 4 MG disintegrating tablet     01/13/19 2228    promethazine (PHENERGAN) 25 MG suppository  Every 6 hours PRN     01/13/19 2228           Melene Plan, DO 01/13/19 2232

## 2019-01-13 NOTE — ED Notes (Signed)
Date and time results received: 01/13/19 2150 (use smartphrase ".now" to insert current time)  Test: lactic acid Critical Value: 2.0  Name of Provider Notified: Dr. Tyrone Nine  Orders Received? Or Actions Taken?: no new orders

## 2019-01-13 NOTE — ED Notes (Signed)
Pt placed on monitor.  

## 2020-03-08 ENCOUNTER — Emergency Department (HOSPITAL_BASED_OUTPATIENT_CLINIC_OR_DEPARTMENT_OTHER)
Admission: EM | Admit: 2020-03-08 | Discharge: 2020-03-08 | Disposition: A | Discharge status: Home / Self Care | Payer: Self-pay | Attending: Emergency Medicine | Admitting: Emergency Medicine

## 2020-03-08 ENCOUNTER — Encounter (HOSPITAL_BASED_OUTPATIENT_CLINIC_OR_DEPARTMENT_OTHER): Payer: Self-pay | Admitting: Emergency Medicine

## 2020-03-08 ENCOUNTER — Other Ambulatory Visit: Payer: Self-pay

## 2020-03-08 DIAGNOSIS — R1012 Left upper quadrant pain: Secondary | ICD-10-CM

## 2020-03-08 DIAGNOSIS — Z79899 Other long term (current) drug therapy: Secondary | ICD-10-CM | POA: Insufficient documentation

## 2020-03-08 DIAGNOSIS — R197 Diarrhea, unspecified: Secondary | ICD-10-CM | POA: Insufficient documentation

## 2020-03-08 DIAGNOSIS — F17200 Nicotine dependence, unspecified, uncomplicated: Secondary | ICD-10-CM | POA: Insufficient documentation

## 2020-03-08 DIAGNOSIS — I10 Essential (primary) hypertension: Secondary | ICD-10-CM | POA: Insufficient documentation

## 2020-03-08 DIAGNOSIS — F10920 Alcohol use, unspecified with intoxication, uncomplicated: Secondary | ICD-10-CM

## 2020-03-08 DIAGNOSIS — R112 Nausea with vomiting, unspecified: Secondary | ICD-10-CM | POA: Insufficient documentation

## 2020-03-08 LAB — CBC WITH DIFFERENTIAL/PLATELET
Abs Immature Granulocytes: 0.03 10*3/uL (ref 0.00–0.07)
Basophils Absolute: 0 10*3/uL (ref 0.0–0.1)
Basophils Relative: 1 %
Eosinophils Absolute: 0 10*3/uL (ref 0.0–0.5)
Eosinophils Relative: 0 %
HCT: 45.9 % (ref 39.0–52.0)
Hemoglobin: 16.2 g/dL (ref 13.0–17.0)
Immature Granulocytes: 1 %
Lymphocytes Relative: 19 %
Lymphs Abs: 1.2 10*3/uL (ref 0.7–4.0)
MCH: 35.1 pg — ABNORMAL HIGH (ref 26.0–34.0)
MCHC: 35.3 g/dL (ref 30.0–36.0)
MCV: 99.4 fL (ref 80.0–100.0)
Monocytes Absolute: 0.4 10*3/uL (ref 0.1–1.0)
Monocytes Relative: 6 %
Neutro Abs: 4.5 10*3/uL (ref 1.7–7.7)
Neutrophils Relative %: 73 %
Platelets: 221 10*3/uL (ref 150–400)
RBC: 4.62 MIL/uL (ref 4.22–5.81)
RDW: 12.2 % (ref 11.5–15.5)
WBC: 6.1 10*3/uL (ref 4.0–10.5)
nRBC: 0 % (ref 0.0–0.2)

## 2020-03-08 LAB — COMPREHENSIVE METABOLIC PANEL
ALT: 29 U/L (ref 0–44)
AST: 49 U/L — ABNORMAL HIGH (ref 15–41)
Albumin: 4.1 g/dL (ref 3.5–5.0)
Alkaline Phosphatase: 68 U/L (ref 38–126)
Anion gap: 17 — ABNORMAL HIGH (ref 5–15)
BUN: 7 mg/dL (ref 6–20)
CO2: 23 mmol/L (ref 22–32)
Calcium: 9.1 mg/dL (ref 8.9–10.3)
Chloride: 103 mmol/L (ref 98–111)
Creatinine, Ser: 0.77 mg/dL (ref 0.61–1.24)
GFR, Estimated: >60 mL/min (ref >60)
Glucose, Bld: 139 mg/dL — ABNORMAL HIGH (ref 70–99)
Potassium: 3.7 mmol/L (ref 3.5–5.1)
Sodium: 143 mmol/L (ref 135–145)
Total Bilirubin: 0.4 mg/dL (ref 0.3–1.2)
Total Protein: 7.3 g/dL (ref 6.5–8.1)

## 2020-03-08 LAB — ETHANOL: Alcohol, Ethyl (B): 315 mg/dL (ref <10)

## 2020-03-08 LAB — LIPASE, BLOOD: Lipase: 30 U/L (ref 11–51)

## 2020-03-08 MED ORDER — KETOROLAC TROMETHAMINE 15 MG/ML IJ SOLN
15.0000 mg | Freq: Once | INTRAMUSCULAR | Status: AC
  Administered 2020-03-08: 15 mg via INTRAVENOUS
  Filled 2020-03-08: qty 1

## 2020-03-08 MED ORDER — SODIUM CHLORIDE 0.9 % IV BOLUS
1000.0000 mL | Freq: Once | INTRAVENOUS | Status: AC
  Administered 2020-03-08: 1000 mL via INTRAVENOUS

## 2020-03-08 MED ORDER — ONDANSETRON 4 MG PO TBDP: ORAL_TABLET | ORAL | 0 refills | Status: DC

## 2020-03-08 MED ORDER — PROMETHAZINE HCL 25 MG RE SUPP: 25.0000 mg | Freq: Four times a day (QID) | RECTAL | 0 refills | Status: DC | PRN

## 2020-03-08 MED ORDER — ALUM & MAG HYDROXIDE-SIMETH 200-200-20 MG/5ML PO SUSP
30.0000 mL | Freq: Once | ORAL | Status: AC
  Administered 2020-03-08: 30 mL via ORAL
  Filled 2020-03-08: qty 30

## 2020-03-08 MED ORDER — ONDANSETRON HCL 4 MG/2ML IJ SOLN
4.0000 mg | Freq: Once | INTRAMUSCULAR | Status: AC
  Administered 2020-03-08: 4 mg via INTRAVENOUS
  Filled 2020-03-08: qty 2

## 2020-03-08 MED ORDER — DROPERIDOL 2.5 MG/ML IJ SOLN
1.2500 mg | Freq: Once | INTRAMUSCULAR | Status: AC
  Administered 2020-03-08: 1.25 mg via INTRAVENOUS
  Filled 2020-03-08: qty 2

## 2020-03-08 MED ORDER — DIPHENHYDRAMINE HCL 50 MG/ML IJ SOLN
25.0000 mg | Freq: Once | INTRAMUSCULAR | Status: AC
  Administered 2020-03-08: 25 mg via INTRAVENOUS
  Filled 2020-03-08: qty 1

## 2020-03-08 NOTE — ED Triage Notes (Signed)
With abd pain n/v/d.

## 2020-03-08 NOTE — ED Provider Notes (Signed)
[facility] EMERGENCY DEPARTMENT Provider Note   CSN: [id number] Arrival date & time: 03/08/20  0541     History Chief Complaint  Patient presents with  . Abdominal Pain    Brandon Fletcher is a 44 y.o. male.  44 yo M with a chief complaint of nausea vomiting and diarrhea.  Patient tells me that he started taking Lexapro and he talked to a paramedic who told him that that medication could cause a reaction like this.  He did started about a week ago.  He denied drinking alcohol heavily today.  Feels that this pain is different than the pain he usually has when he drinks alcohol.  Pain usually is in the epigastrium and this pain is more on the left upper side.  Unsure if fevers and chills.  The history is provided by the patient.  Abdominal Pain Pain location:  LUQ Pain quality: aching   Pain radiates to:  Does not radiate Pain severity:  Moderate Onset quality:  Gradual Duration:  6 hours Timing:  Constant Progression:  Unchanged Chronicity:  New Context comment:  Medication change Relieved by:  Nothing Worsened by:  Nothing Ineffective treatments:  None tried Associated symptoms: nausea and vomiting   Associated symptoms: no chest pain, no chills, no diarrhea, no fever and no shortness of breath        Past Medical History:  Diagnosis Date  . H/O ETOH abuse   . Hypertension   . Pancreatitis     There are no problems to display for this patient.   Past Surgical History:  Procedure Laterality Date  . APPENDECTOMY         No family history on file.  Social History   Tobacco Use  . Smoking status: Current Every Day Smoker  . Smokeless tobacco: Never Used  Vaping Use  . Vaping Use: Never used  Substance Use Topics  . Alcohol use: Yes  . Drug use: Not Currently    Types: Marijuana    Home Medications Prior to Admission medications   Medication Sig Start Date End Date Taking? Authorizing Provider  chlordiazePOXIDE (LIBRIUM) 25 MG capsule  50mg  PO TID x 1D, then 25-50mg  PO BID X 1D, then 25-50mg  PO QD X 1D 01/13/19   Melene PlanFloyd, Little W[place], DO  cloNIDine (CATAPRES - DOSED IN MG/24 HR) 0.1 mg/24hr patch Place 1 patch (0.1 mg total) onto the skin once a week. 07/01/18   Arby BarrettePfeiffer, Marcy, MD  diphenhydrAMINE (BENADRYL) 25 MG tablet Take 25 mg of Benadryl with Reglan every 6 hours as needed for the next 2 to 3 days. 07/01/18   Arby BarrettePfeiffer, Marcy, MD  metoCLOPramide (REGLAN) 10 MG tablet Take 1 tablet (10 mg total) by mouth every 6 (six) hours as needed for nausea (nausea/headache). 07/01/18   Arby BarrettePfeiffer, Marcy, MD  ondansetron (ZOFRAN ODT) 4 MG disintegrating tablet 4mg  ODT q4 hours prn nausea/vomit 03/08/20   Melene PlanFloyd, Erinne Gillentine, DO  ondansetron (ZOFRAN) 4 MG tablet Take 1 tablet (4 mg total) by mouth every 6 (six) hours. 02/06/18   Raeford RazorKohut, Stephen, MD  pantoprazole (PROTONIX) 20 MG tablet Take 1 tablet (20 mg total) by mouth daily. 07/01/18   Arby BarrettePfeiffer, Marcy, MD  promethazine (PHENERGAN) 25 MG suppository Place 1 suppository (25 mg total) rectally every 6 (six) hours as needed for nausea or vomiting. 03/08/20   Melene PlanFloyd, Retta Pitcher, DO  sucralfate (CARAFATE) 1 g tablet Take by mouth. 06/29/18   [provider]    Allergies    Patient has no known  allergies.  Review of Systems   Review of Systems  Constitutional: Negative for chills and fever.  HENT: Negative for congestion and facial swelling.   Eyes: Negative for discharge and visual disturbance.  Respiratory: Negative for shortness of breath.   Cardiovascular: Negative for chest pain and palpitations.  Gastrointestinal: Positive for abdominal pain, nausea and vomiting. Negative for diarrhea.  Musculoskeletal: Negative for arthralgias and myalgias.  Skin: Negative for color change and rash.  Neurological: Negative for tremors, syncope and headaches.  Psychiatric/Behavioral: Negative for confusion and dysphoric mood.    Physical Exam Updated Vital Signs BP (!) 157/107 (BP Location: Left Arm)   Pulse 65    Temp 97.7 F (36.5 C) (Oral)   Resp 15   Ht 6' (1.829 m)   Wt 72.6 kg   SpO2 95%   BMI 21.70 kg/m   Physical Exam Vitals and nursing note reviewed.  Constitutional:      Appearance: He is well-developed.  HENT:     Head: Normocephalic and atraumatic.  Eyes:     Pupils: Pupils are equal, round, and reactive to light.  Neck:     Vascular: No JVD.  Cardiovascular:     Rate and Rhythm: Normal rate and regular rhythm.     Heart sounds: No murmur heard.  No friction rub. No gallop.   Pulmonary:     Effort: No respiratory distress.     Breath sounds: No wheezing.  Abdominal:     General: There is no distension.     Tenderness: There is no abdominal tenderness. There is no guarding or rebound.     Comments: Benign abdominal exam  Musculoskeletal:        General: Normal range of motion.     Cervical back: Normal range of motion and neck supple.  Skin:    Coloration: Skin is not pale.     Findings: No rash.  Neurological:     Mental Status: He is alert and oriented to person, place, and time.  Psychiatric:        Behavior: Behavior normal.     ED Results / Procedures / Treatments   Labs (all labs ordered are listed, but only abnormal results are displayed) Labs Reviewed  CBC WITH DIFFERENTIAL/PLATELET - Abnormal; Notable for the following components:      Result Value   MCH 35.1 (*)    All other components within normal limits  ETHANOL - Abnormal; Notable for the following components:   Alcohol, Ethyl (B) 315 (*)    All other components within normal limits  COMPREHENSIVE METABOLIC PANEL - Abnormal; Notable for the following components:   Glucose, Bld 139 (*)    AST 49 (*)    Anion gap 17 (*)    All other components within normal limits  LIPASE, BLOOD    EKG EKG Interpretation  Date/Time:  Tuesday March 08 2020 06:17:27 EDT Ventricular Rate:  80 PR Interval:    QRS Duration: 109 QT Interval:  465 QTC Calculation: 537 R Axis:   86 Text  Interpretation: Sinus rhythm ST elev, probable normal early repol pattern Prolonged QT interval No previous ECGs available Interpretation limited secondary to artifact Confirmed by Zadie Rhine ([id number]) on 03/08/2020 6:23:07 AM   Radiology No results found.  Procedures Procedures (including critical care time)  Medications Ordered in ED Medications  ondansetron (ZOFRAN) injection 4 mg (4 mg Intravenous Given 03/08/20 0555)  droperidol (INAPSINE) 2.5 MG/ML injection 1.25 mg (1.25 mg Intravenous Given 03/08/20 0735)  diphenhydrAMINE (  BENADRYL) injection 25 mg (25 mg Intravenous Given 03/08/20 0728)  ketorolac (TORADOL) 15 MG/ML injection 15 mg (15 mg Intravenous Given 03/08/20 0731)  sodium chloride 0.9 % bolus 1,000 mL (1,000 mLs Intravenous New Bag/Given 03/08/20 0734)  alum & mag hydroxide-simeth (MAALOX/MYLANTA) 200-200-20 MG/5ML suspension 30 mL (30 mLs Oral Given 03/08/20 8119)    ED Course  I have reviewed the triage vital signs and the nursing notes.  Pertinent labs & imaging results that were available during my care of the patient were reviewed by me and considered in my medical decision making (see chart for details).    MDM Rules/Calculators/A&P                          44 yo M with a chief complaints of left upper quadrant abdominal pain.  Patient feels like this is a reaction to his Lexapro.  He initially denied drinking alcohol until I told him that his alcohol tested returned back significantly elevated.  He told me that that could be true because no one had drawn his blood.  He has an IV in place and I suggested that maybe blood was drawn with placement of the IV.  He has a benign abdominal exam and was moaning when I went into the room but when I had a conversation with him he was able to sit up straight was awake and alert with no significant distress.  We will give another dose of an antiemetic as he says he still nauseated.  Bolus of IV fluids for his volume loss  from vomiting.  He has an elevated anion gap without metabolic acidosis is likely secondary to alcohol use.  LFTs and lipase are unremarkable.  EtOH is 315.  Patient is able to tolerate by mouth without difficulty.  Will discharge the patient home.  PCP follow-up.  8:29 AM:  I have discussed the diagnosis/risks/treatment options with the patient and believe the pt to be eligible for discharge home to follow-up with PCP. We also discussed returning to the ED immediately if new or worsening sx occur. We discussed the sx which are most concerning (e.g., sudden worsening pain, fever, inability to tolerate by mouth) that necessitate immediate return. Medications administered to the patient during their visit and any new prescriptions provided to the patient are listed below.  Medications given during this visit Medications  ondansetron (ZOFRAN) injection 4 mg (4 mg Intravenous Given 03/08/20 0555)  droperidol (INAPSINE) 2.5 MG/ML injection 1.25 mg (1.25 mg Intravenous Given 03/08/20 0735)  diphenhydrAMINE (BENADRYL) injection 25 mg (25 mg Intravenous Given 03/08/20 0728)  ketorolac (TORADOL) 15 MG/ML injection 15 mg (15 mg Intravenous Given 03/08/20 0731)  sodium chloride 0.9 % bolus 1,000 mL (1,000 mLs Intravenous New Bag/Given 03/08/20 0734)  alum & mag hydroxide-simeth (MAALOX/MYLANTA) 200-200-20 MG/5ML suspension 30 mL (30 mLs Oral Given 03/08/20 0804)     The patient appears reasonably screen and/or stabilized for discharge and I doubt any other medical condition or other [facility] requiring further screening, evaluation, or treatment in the ED at this time prior to discharge.   Final Clinical Impression(s) / ED Diagnoses Final diagnoses:  LUQ abdominal pain  Alcoholic intoxication without complication (HCC)    Rx / DC Orders ED Discharge Orders         Ordered    ondansetron (ZOFRAN ODT) 4 MG disintegrating tablet        03/08/20 0827    promethazine (PHENERGAN) 25 MG suppository  Every 6  hours PRN        03/08/20 0827           Melene Plan, DO 03/08/20 [phone]

## 2020-03-08 NOTE — ED Notes (Signed)
ED Provider at bedside. 

## 2020-03-08 NOTE — ED Notes (Signed)
Pt admits to drinking etoh last night and smells strongly of etoh.

## 2020-03-08 NOTE — ED Notes (Signed)
Pt removed all monitoring equipment and is laying in floor. Pt spilled vomit bag in bed. Linens changed and pt returned to bed. Encouraged pt to use call bell for assistance. Pt continues to be uncooperative.

## 2020-03-08 NOTE — ED Notes (Signed)
Arrived to patient's room with white, clear vomitus all over the floor.  Patient stated that he vomited and don't feel good.  I informed him that there is an emesis bag.  Pt is apologetic.  Clean emesis bag given to him and informed him of all the medicine I have given him.

## 2020-03-08 NOTE — Discharge Instructions (Signed)
Try pepcid or tagamet up to twice a day.  Try to avoid things that may make this worse, most commonly these are spicy foods tomato based products fatty foods chocolate and peppermint.  Alcohol and tobacco can also make this worse.  Return to the emergency department for sudden worsening pain fever or inability to eat or drink.  I prescribed you 2 different antinausea medications.  These are the only 2 that you can take when you are actively vomiting because 1 will dissolve under your tongue and the other one you can stick in your bottom.  Please try and cut back on your alcohol intake.

## 2020-06-11 ENCOUNTER — Ambulatory Visit (HOSPITAL_COMMUNITY): Payer: Self-pay

## 2020-06-11 ENCOUNTER — Ambulatory Visit (HOSPITAL_COMMUNITY)
Admission: EM | Admit: 2020-06-11 | Discharge: 2020-06-11 | Disposition: A | Discharge status: Home / Self Care | Payer: Worker's Compensation | Attending: Student | Admitting: Student

## 2020-06-11 ENCOUNTER — Encounter (HOSPITAL_COMMUNITY): Payer: Self-pay | Admitting: *Deleted

## 2020-06-11 ENCOUNTER — Ambulatory Visit (INDEPENDENT_AMBULATORY_CARE_PROVIDER_SITE_OTHER): Payer: Worker's Compensation

## 2020-06-11 ENCOUNTER — Other Ambulatory Visit: Payer: Self-pay

## 2020-06-11 DIAGNOSIS — S39012A Strain of muscle, fascia and tendon of lower back, initial encounter: Secondary | ICD-10-CM

## 2020-06-11 DIAGNOSIS — X500XXA Overexertion from strenuous movement or load, initial encounter: Secondary | ICD-10-CM

## 2020-06-11 DIAGNOSIS — S3992XA Unspecified injury of lower back, initial encounter: Secondary | ICD-10-CM | POA: Diagnosis not present

## 2020-06-11 MED ORDER — TIZANIDINE HCL 2 MG PO CAPS: 2.0000 mg | ORAL_CAPSULE | Freq: Three times a day (TID) | ORAL | 0 refills | Status: AC

## 2020-06-11 MED ORDER — PREDNISONE 10 MG (21) PO TBPK: ORAL_TABLET | Freq: Every day | ORAL | 0 refills | Status: AC

## 2020-06-11 MED ORDER — NAPROXEN 500 MG PO TABS: 500.0000 mg | ORAL_TABLET | Freq: Two times a day (BID) | ORAL | 0 refills | Status: AC

## 2020-06-11 NOTE — ED Triage Notes (Signed)
Pt reports he was lifting a 40 lbs box at work yesterday when his back started to hurt. Pt ambulatory to room.

## 2020-06-11 NOTE — ED Provider Notes (Signed)
[facility]    CSN: [id number] Arrival date & time: 06/11/20  1052      History   Chief Complaint Chief Complaint  Patient presents with  . Back Pain    HPI Brandon Fletcher is a 45 y.o. male presenting for back injury. History etoh abuse most recent ED visit for this 02/2020, pancreatitis, L1/L2 spinal surgery 2015. Presenting today for back injury sustained 1 day ago. States he was lifting 40 pound box at work and twisted his back while lifting this. He immediately felt significant left-sided pain. He went home and tried hot bath, Alleve, and Flexeril with minimal relief. Today he notes significant L lumbar paraspinous muscle tenderness to palpation and with bending to the left. Denies pain shooting down legs, denies numbness in arms/legs, denies weakness in arms/legs, denies saddle anesthesia, denies bowel/bladder incontinence.   HPI  Past Medical History:  Diagnosis Date  . H/O ETOH abuse   . Hypertension   . Pancreatitis     There are no problems to display for this patient.   Past Surgical History:  Procedure Laterality Date  . APPENDECTOMY    . BACK SURGERY         Home Medications    Prior to Admission medications   Medication Sig Start Date End Date Taking? Authorizing Provider  naproxen (NAPROSYN) 500 MG tablet Take 1 tablet (500 mg total) by mouth 2 (two) times daily. 06/11/20  Yes Rhys Martini, PA-C  predniSONE (STERAPRED UNI-PAK 21 TAB) 10 MG (21) TBPK tablet Take by mouth daily. Take 6 tabs by mouth daily  for 2 days, then 5 tabs for 2 days, then 4 tabs for 2 days, then 3 tabs for 2 days, 2 tabs for 2 days, then 1 tab by mouth daily for 2 days 06/11/20  Yes Cheree Ditto, Lyman Speller, PA-C  tizanidine (ZANAFLEX) 2 MG capsule Take 1 capsule (2 mg total) by mouth 3 (three) times daily. 06/11/20  Yes Rhys Martini, PA-C  metoCLOPramide (REGLAN) 10 MG tablet Take 1 tablet (10 mg total) by mouth every 6 (six) hours as needed for nausea (nausea/headache).  07/01/18   Arby Barrette, MD  diphenhydrAMINE (BENADRYL) 25 MG tablet Take 25 mg of Benadryl with Reglan every 6 hours as needed for the next 2 to 3 days. 07/01/18 06/11/20  Arby Barrette, MD  pantoprazole (PROTONIX) 20 MG tablet Take 1 tablet (20 mg total) by mouth daily. 07/01/18 06/11/20  Arby Barrette, MD  promethazine (PHENERGAN) 25 MG suppository Place 1 suppository (25 mg total) rectally every 6 (six) hours as needed for nausea or vomiting. 03/08/20 06/11/20  Melene Plan, DO  sucralfate (CARAFATE) 1 g tablet Take by mouth. 06/29/18 06/11/20  [provider]    Family History History reviewed. No pertinent family history.  Social History Social History   Tobacco Use  . Smoking status: Current Every Day Smoker  . Smokeless tobacco: Never Used  Vaping Use  . Vaping Use: Never used  Substance Use Topics  . Alcohol use: Yes  . Drug use: Not Currently    Types: Marijuana     Allergies   Patient has no known allergies.   Review of Systems Review of Systems  Constitutional: Negative for chills, fever and unexpected weight change.  Respiratory: Negative for chest tightness and shortness of breath.   Cardiovascular: Negative for chest pain and palpitations.  Gastrointestinal: Negative for abdominal pain, diarrhea, nausea and vomiting.  Genitourinary: Negative for decreased urine volume, difficulty urinating and frequency.  Musculoskeletal: Positive for back pain. Negative for arthralgias, gait problem, joint swelling, myalgias, neck pain and neck stiffness.  Skin: Negative for wound.  Neurological: Negative for dizziness, tremors, seizures, syncope, facial asymmetry, speech difficulty, weakness, light-headedness, numbness and headaches.     Physical Exam Triage Vital Signs ED Triage Vitals  Enc Vitals Group     BP 06/11/20 1113 125/76     Pulse Rate 06/11/20 1113 (!) 110     Resp 06/11/20 1113 16     Temp 06/11/20 1113 98.4 F (36.9 C)     Temp Source 06/11/20  1113 Oral     SpO2 06/11/20 1113 97 %     Weight --      Height --      Head Circumference --      Peak Flow --      Pain Score 06/11/20 1107 10     Pain Loc --      Pain Edu? --      Excl. in GC? --    No data found.  Updated Vital Signs BP 125/76 (BP Location: Right Arm)   Pulse (!) 110   Temp 98.4 F (36.9 C) (Oral)   Resp 16   SpO2 97%   Visual Acuity Right Eye Distance:   Left Eye Distance:   Bilateral Distance:    Right Eye Near:   Left Eye Near:    Bilateral Near:     Physical Exam Vitals reviewed.  Constitutional:      General: He is not in acute distress.    Appearance: Normal appearance. He is not ill-appearing.  HENT:     Head: Normocephalic and atraumatic.  Cardiovascular:     Rate and Rhythm: Regular rhythm. Tachycardia present.     Heart sounds: Normal heart sounds.  Pulmonary:     Effort: Pulmonary effort is normal.     Breath sounds: Normal breath sounds and air entry.  Abdominal:     Tenderness: There is no abdominal tenderness. There is no right CVA tenderness, left CVA tenderness, guarding or rebound.     Comments: No bowel or bladder incontinence.  Musculoskeletal:     Cervical back: Normal range of motion. No swelling, deformity, signs of trauma, rigidity, spasms, tenderness, bony tenderness or crepitus. No pain with movement.     Thoracic back: No swelling, deformity, signs of trauma, spasms, tenderness or bony tenderness. Normal range of motion. No scoliosis.     Lumbar back: Spasms, tenderness and bony tenderness present. No swelling, deformity or signs of trauma. Normal range of motion. Negative right straight leg raise test and negative left straight leg raise test. No scoliosis.     Comments: L lumbar paraspinous muscle tenderness to palpation. No bony deformity or ecchymosis. Bony tenderness overlying L SI joint.  Strength 5/5 in UEs and LEs, sensation intact.   Neurological:     General: No focal deficit present.     Mental Status: He  is alert.     Cranial Nerves: No cranial nerve deficit.     Comments: Strength 5/5 in UEs and LEs. Gait normal. Sensation intact in UEs and LEs.   Psychiatric:        Mood and Affect: Mood normal.        Behavior: Behavior normal.        Thought Content: Thought content normal.        Judgment: Judgment normal.      UC Treatments / Results  Labs (all labs ordered are  listed, but only abnormal results are displayed) Labs Reviewed - No data to display  EKG   Radiology DG Lumbar Spine Complete  Result Date: 06/11/2020 CLINICAL DATA:  45 year old male with back injury secondary to lifting a heavy object EXAM: LUMBAR SPINE - COMPLETE 4+ VIEW COMPARISON:  Abdominal CT 07/26/2019 FINDINGS: Lumbar Spine: Lumbar vertebral elements maintain similar reversal of the typical lordosis which was seen on prior CT. No subluxation/retrolisthesis/anterolisthesis. No acute fracture line identified. Vertebral body heights maintained. Disc space narrowing is present at the T12-L1 and L1-L2 levels, similar to prior. Oblique images demonstrate no displaced pars defect. No significant facet changes. IMPRESSION: Negative for acute fracture or malalignment of the lumbar spine. Early degenerative changes, which are similar to that on the CT of 07/26/2019 Electronically Signed   By: Gilmer Mor D.O.   On: 06/11/2020 12:07    Procedures Procedures (including critical care time)  Medications Ordered in UC Medications - No data to display  Initial Impression / Assessment and Plan / UC Course  I have reviewed the triage vital signs and the nursing notes.  Pertinent labs & imaging results that were available during my care of the patient were reviewed by me and considered in my medical decision making (see chart for details).      Xray lumbar spine:  Negative for acute fracture or malalignment of the lumbar spine. Early degenerative changes, which are similar to that on the CT of 07/26/2019   Plan to  treat with Zanaflex, prednisone, naproxen as below. Pt with current alcohol use disorder and so no narcotics were prescribed today for pain relief.   Spent over 40 minutes obtaining H&P, performing physical, interpreting films, discussing results, discussing treatment plan and plan for follow-up with patient. Patient agrees with plan.   He will follow-up with occupational health for additional paperwork, exams, etc. Information provided.    Final Clinical Impressions(s) / UC Diagnoses   Final diagnoses:  Strain of lumbar region, initial encounter     Discharge Instructions     -Take prednisone as directed on package. This is what will heal your back. It can take 2-3 days for you to notice significant improvement from this.  -Zanaflex (tizanidine) is a muscle relaxer that will provide you with more acute relief. Take this up to 3x daily. It can cause drowsiness, so take when you don't have to drive or operate machinery.  -Naproxen is a pain reliever that can be taken 2x daily with food.  -Follow-up with occupational health (information provided) for further evaluation and paperwork  -Follow-up with orthopedist (information below) or your spine surgeon for further evaluation and management of back pain    ED Prescriptions    Medication Sig Dispense Auth. Provider   predniSONE (STERAPRED UNI-PAK 21 TAB) 10 MG (21) TBPK tablet Take by mouth daily. Take 6 tabs by mouth daily  for 2 days, then 5 tabs for 2 days, then 4 tabs for 2 days, then 3 tabs for 2 days, 2 tabs for 2 days, then 1 tab by mouth daily for 2 days 42 tablet Rhys Martini, PA-C   tizanidine (ZANAFLEX) 2 MG capsule Take 1 capsule (2 mg total) by mouth 3 (three) times daily. 21 capsule Ignacia Bayley E, PA-C   naproxen (NAPROSYN) 500 MG tablet Take 1 tablet (500 mg total) by mouth 2 (two) times daily. 30 tablet Rhys Martini, PA-C     I have reviewed the PDMP during this encounter.   Rhys Martini,  PA-C 06/11/20  1246

## 2020-06-11 NOTE — Discharge Instructions (Addendum)
-  Take prednisone as directed on package. This is what will heal your back. It can take 2-3 days for you to notice significant improvement from this.  -Zanaflex (tizanidine) is a muscle relaxer that will provide you with more acute relief. Take this up to 3x daily. It can cause drowsiness, so take when you don't have to drive or operate machinery.  -Naproxen is a pain reliever that can be taken 2x daily with food.  -Follow-up with occupational health (information provided) for further evaluation and paperwork  -Follow-up with orthopedist (information below) or your spine surgeon for further evaluation and management of back pain

## 2021-06-23 IMAGING — DX DG LUMBAR SPINE COMPLETE 4+V
5 series · 5 of 5 positions shown · non-contrast
Comparison: Abdominal CT 07/26/2019

CLINICAL DATA: 44-year-old male with back injury secondary to
lifting a heavy object

EXAM:
LUMBAR SPINE - COMPLETE 4+ VIEW

[l-spine ap]
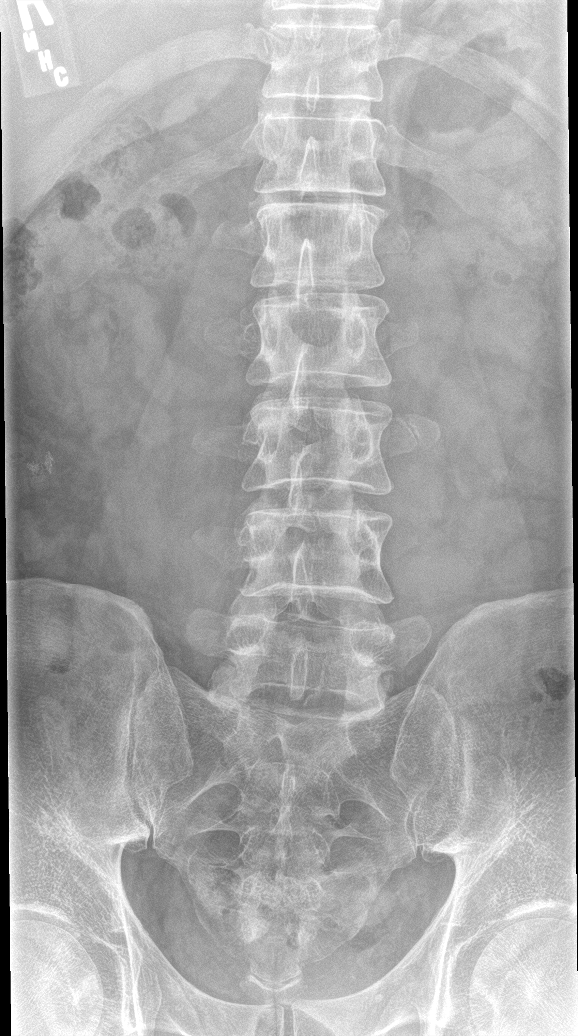

[l-spine obl (1 of 2)]
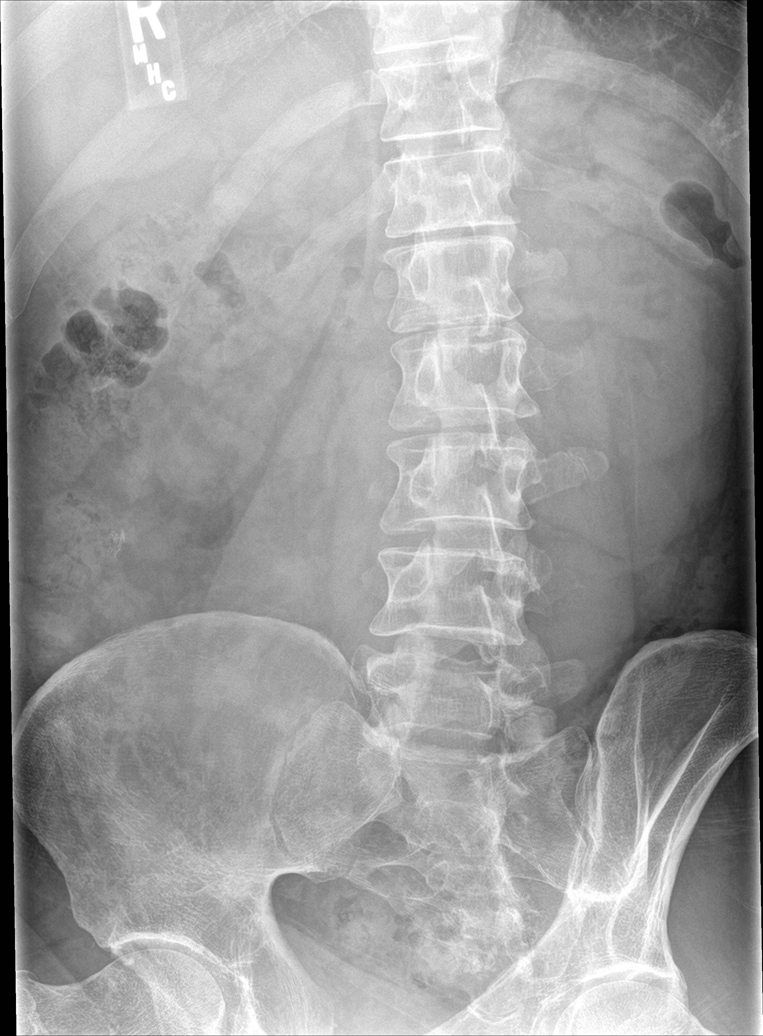

[l-spine obl (2 of 2)]
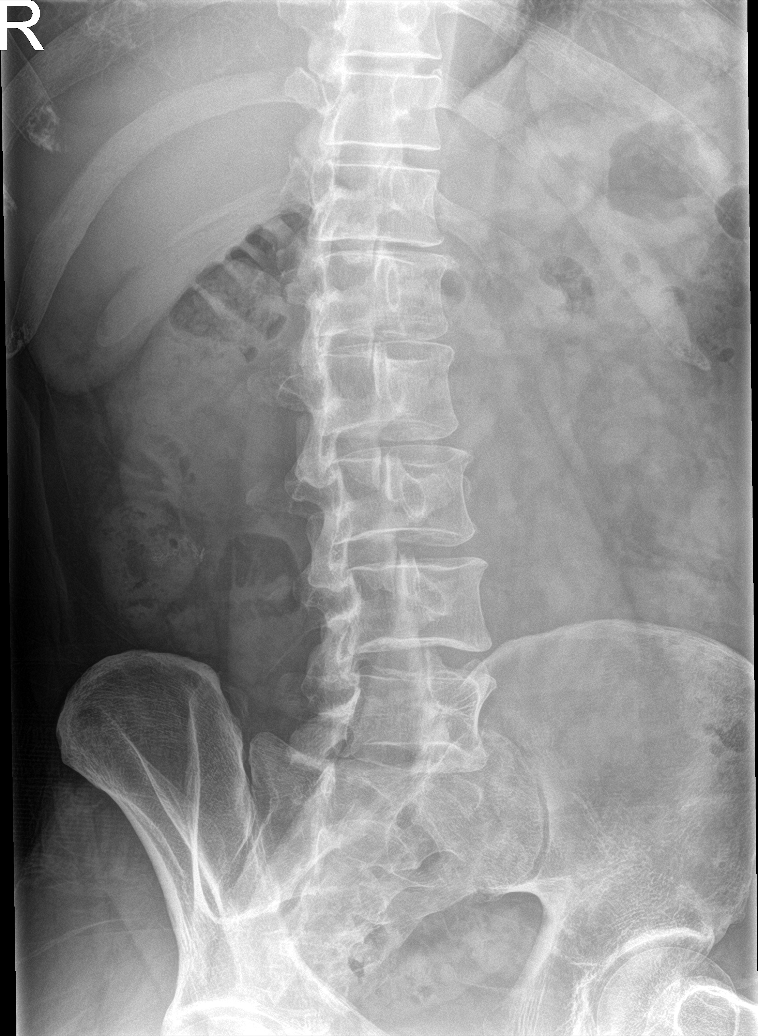

[l-spine lat]
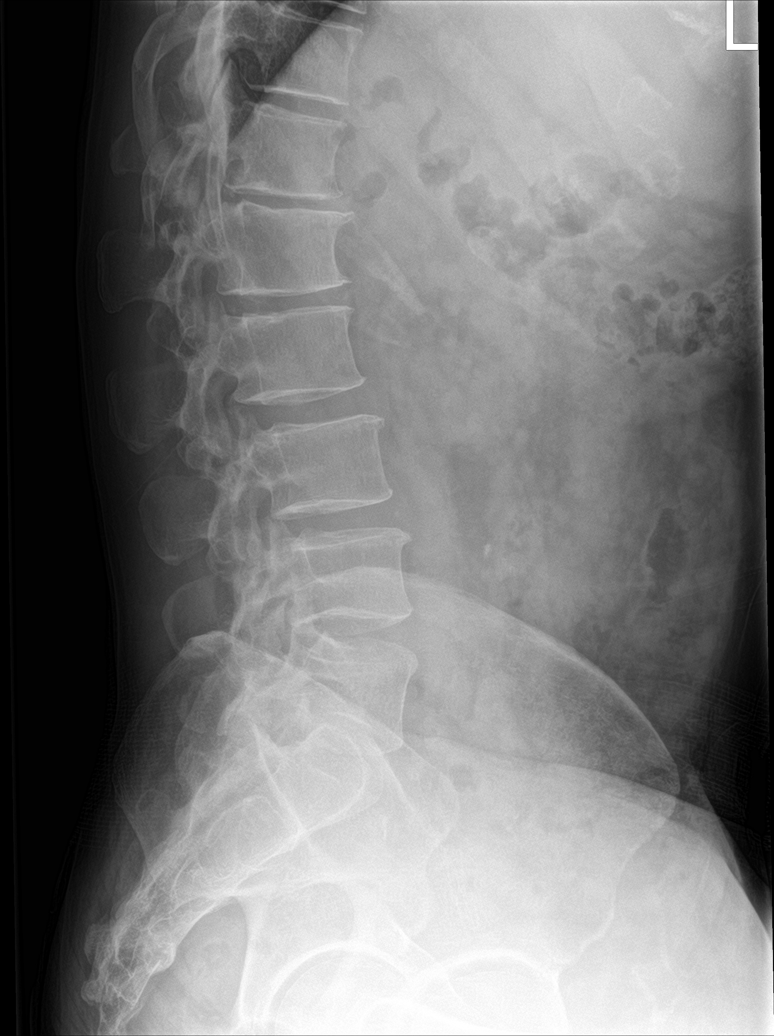

[l-spine spot]
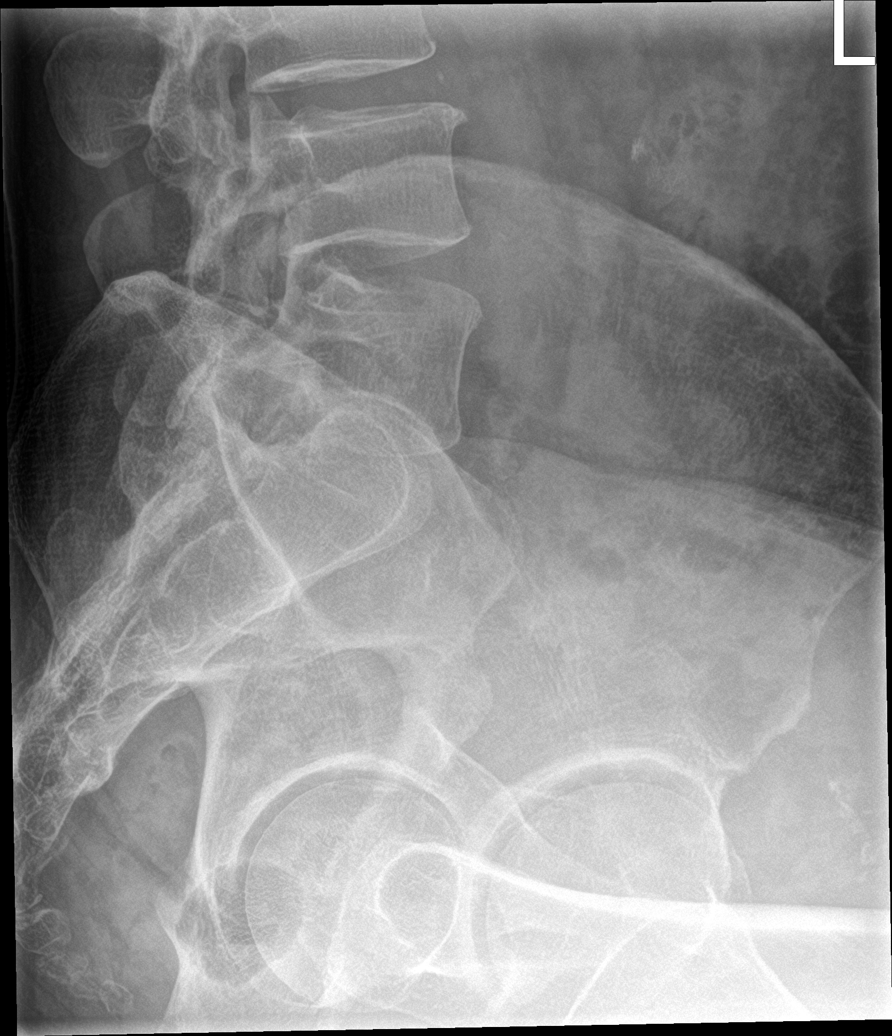

[5 of 5 positions shown; findings below may reference images not displayed]

FINDINGS: Lumbar Spine:

Lumbar vertebral elements maintain similar reversal of the typical
lordosis which was seen on prior CT. No
subluxation/retrolisthesis/anterolisthesis.

No acute fracture line identified.

Vertebral body heights maintained.

Disc space narrowing is present at the T12-L1 and L1-L2 levels,
similar to prior.

Oblique images demonstrate no displaced pars defect.

No significant facet changes.
IMPRESSION: Negative for acute fracture or malalignment of the lumbar spine.

Early degenerative changes, which are similar to that on the CT of
07/26/2019
